# Patient Record
Sex: Male | Born: 1995 | Race: Black or African American | Hispanic: No | Marital: Single | Smoking: Current every day smoker
Health system: Southern US, Community
[De-identification: ages and names within clinical notes are randomized; demographics above are authoritative.]

## PROBLEM LIST (undated history)

## (undated) HISTORY — PX: KNEE SURGERY: SHX244

---
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
## 2018-03-03 ENCOUNTER — Encounter (HOSPITAL_COMMUNITY): Payer: Self-pay | Admitting: Emergency Medicine

## 2018-03-03 ENCOUNTER — Other Ambulatory Visit: Payer: Self-pay

## 2018-03-03 ENCOUNTER — Emergency Department (HOSPITAL_COMMUNITY)
Admission: EM | Admit: 2018-03-03 | Discharge: 2018-03-03 | Disposition: A | Discharge status: Home / Self Care | Payer: Self-pay | Attending: Emergency Medicine | Admitting: Emergency Medicine

## 2018-03-03 ENCOUNTER — Emergency Department (HOSPITAL_COMMUNITY): Payer: Self-pay

## 2018-03-03 DIAGNOSIS — Y9289 Other specified places as the place of occurrence of the external cause: Secondary | ICD-10-CM | POA: Insufficient documentation

## 2018-03-03 DIAGNOSIS — Y9389 Activity, other specified: Secondary | ICD-10-CM | POA: Insufficient documentation

## 2018-03-03 DIAGNOSIS — F17210 Nicotine dependence, cigarettes, uncomplicated: Secondary | ICD-10-CM | POA: Insufficient documentation

## 2018-03-03 DIAGNOSIS — S83402A Sprain of unspecified collateral ligament of left knee, initial encounter: Secondary | ICD-10-CM | POA: Insufficient documentation

## 2018-03-03 DIAGNOSIS — W1849XA Other slipping, tripping and stumbling without falling, initial encounter: Secondary | ICD-10-CM | POA: Insufficient documentation

## 2018-03-03 DIAGNOSIS — Y990 Civilian activity done for income or pay: Secondary | ICD-10-CM | POA: Insufficient documentation

## 2018-03-03 MED ORDER — NAPROXEN 500 MG PO TABS: 500.0000 mg | ORAL_TABLET | Freq: Two times a day (BID) | ORAL | 0 refills | Status: AC

## 2018-03-03 NOTE — ED Triage Notes (Signed)
Pt c/o L knee pain, injured at work, slipped on wet floor and pt states his knee turned inward. Able to walk to triage but pt reports pain with weight bearing and bending the knee.

## 2018-03-03 NOTE — ED Notes (Signed)
Ortho called 

## 2018-03-03 NOTE — ED Provider Notes (Signed)
[facility]-EMERGENCY DEPT Provider Note   CSN: [id number] Arrival date & time: 03/03/18  1306    History   Chief Complaint Chief Complaint  Patient presents with  . Knee Pain    HPI Scorpio Easterly is a 23 y.o. male who presents to the ED with left knee pain. Patient reports while at work last night he was walking into the kitchen area and slipped on grease on the floor causing him to twist his knee.      HPI  History reviewed. No pertinent past medical history.  There are no active problems to display for this patient.   History reviewed. No pertinent surgical history.      Home Medications    Prior to Admission medications   Medication Sig Start Date End Date Taking? Authorizing Provider  naproxen (NAPROSYN) 500 MG tablet Take 1 tablet (500 mg total) by mouth 2 (two) times daily. 03/03/18   Janne Napoleon, NP    Family History No family history on file.  Social History Social History   Tobacco Use  . Smoking status: Current Every Day Smoker    Packs/day: 0.50    Types: Cigarettes  . Smokeless tobacco: Never Used  Substance Use Topics  . Alcohol use: Yes  . Drug use: Yes    Types: Marijuana     Allergies   Patient has no known allergies.   Review of Systems Review of Systems  Musculoskeletal: Positive for arthralgias.       Left knee pain  All other systems reviewed and are negative.    Physical Exam Updated Vital Signs BP 101/70   Pulse 64   Temp 98.3 F (36.8 C) (Oral)   Resp 16   SpO2 100%   Physical Exam Vitals signs and nursing note reviewed.  Constitutional:      General: He is not in acute distress.    Appearance: He is well-developed.  HENT:     Head: Normocephalic.     Mouth/Throat:     Mouth: Mucous membranes are moist.  Eyes:     Conjunctiva/sclera: Conjunctivae normal.  Neck:     Musculoskeletal: Neck supple.  Cardiovascular:     Rate and Rhythm: Normal rate.  Pulmonary:     Effort: Pulmonary  effort is normal.  Musculoskeletal:     Left knee: He exhibits swelling. He exhibits no deformity, no laceration, no erythema and normal alignment. Decreased range of motion: due to pain. Tenderness found. MCL tenderness noted.     Comments: Pedal pulse 2+. Pain with range of motion.   Skin:    General: Skin is warm and dry.  Neurological:     Mental Status: He is alert and oriented to person, place, and time.      ED Treatments / Results  Labs (all labs ordered are listed, but only abnormal results are displayed) Labs Reviewed - No data to display  Radiology Dg Knee Complete 4 Views Left  Result Date: 03/03/2018 CLINICAL DATA:  Twisting injury left knee on a wet floor today. Pain. Initial encounter. EXAM: LEFT KNEE - COMPLETE 4+ VIEW COMPARISON:  None. FINDINGS: No evidence of fracture, dislocation, or joint effusion. No evidence of arthropathy or other focal bone abnormality. Soft tissues are unremarkable. IMPRESSION: Normal exam. Electronically Signed   By: Drusilla Kanner M.D.   On: 03/03/2018 14:05    Procedures Procedures (including critical care time)  Medications Ordered in ED Medications - No data to display   Initial Impression /  Assessment and Plan / ED Course  I have reviewed the triage vital signs and the nursing notes. 23 y.o. male here with left knee pain s/p injury at work last night stable for d/c without focal neuro deficits. Knee sleeve applied, crutches and pain management. F/u with ortho if symptoms are not improving with treatment.   Final Clinical Impressions(s) / ED Diagnoses   Final diagnoses:  Sprain of collateral ligament of left knee, initial encounter    ED Discharge Orders         Ordered    naproxen (NAPROSYN) 500 MG tablet  2 times daily     03/03/18 1533           Damian Leavell [place], [place] 03/03/18 2322    Gerhard Munch, MD 03/07/18 1900

## 2018-05-04 ENCOUNTER — Emergency Department (HOSPITAL_COMMUNITY)
Admission: EM | Admit: 2018-05-04 | Discharge: 2018-05-04 | Disposition: A | Discharge status: Home / Self Care | Payer: Self-pay | Attending: Emergency Medicine | Admitting: Emergency Medicine

## 2018-05-04 ENCOUNTER — Encounter (HOSPITAL_COMMUNITY): Payer: Self-pay

## 2018-05-04 ENCOUNTER — Other Ambulatory Visit: Payer: Self-pay

## 2018-05-04 DIAGNOSIS — Z7189 Other specified counseling: Secondary | ICD-10-CM | POA: Insufficient documentation

## 2018-05-04 DIAGNOSIS — F17210 Nicotine dependence, cigarettes, uncomplicated: Secondary | ICD-10-CM | POA: Insufficient documentation

## 2018-05-04 DIAGNOSIS — Z79899 Other long term (current) drug therapy: Secondary | ICD-10-CM | POA: Insufficient documentation

## 2018-05-04 NOTE — ED Triage Notes (Signed)
States just went to [place] and came back has no symptoms and boss wants a release to come back to work.

## 2018-05-04 NOTE — Discharge Instructions (Addendum)
There is no way to know whether you have COVID19 or not, and we are not currently testing patients unless it's indicated, which for you it is not. Since you've traveled, you will need to self-quarantine for at least 2 weeks and monitor for any symptoms at home. Stay well hydrated and get plenty of rest. Quarantine yourself at home. Follow up with your regular doctor as needed for any future medical concerns. Return to the ER for any changes or worsening conditions.

## 2018-05-04 NOTE — ED Provider Notes (Signed)
[facility]-EMERGENCY DEPT Provider Note   CSN: [id number] Arrival date & time: 05/04/18  1629    History   Chief Complaint Chief Complaint  Patient presents with  . work note    HPI    Peter Christensen is a 23 y.o. male who presents to the ED for a work note.  [place] and [place] today, and his job wants him to get a work note stating that he can return to work.  [place], denies any known sick contacts or COVID-19 exposures.  He denies having any fevers or chills, URI symptoms, cough, chest pain, shortness of breath, abdominal pain, nausea, vomiting, diarrhea, constipation, dysuria, hematuria, body aches, myalgias, arthralgias, numbness, tingling, focal weakness, or any other complaints at this time.  He just wants a work note.  The history is provided by the patient and medical records. No language interpreter was used.  Influenza  Presenting symptoms: no cough, no diarrhea, no fever, no myalgias, no nausea, no rhinorrhea, no shortness of breath, no sore throat and no vomiting   Associated symptoms: no chills     No past medical history on file.  There are no active problems to display for this patient.   No past surgical history on file.      Home Medications    Prior to Admission medications   Medication Sig Start Date End Date Taking? Authorizing Provider  naproxen (NAPROSYN) 500 MG tablet Take 1 tablet (500 mg total) by mouth 2 (two) times daily. 03/03/18   Janne NapoleonNeese, Hope M, NP    Family History No family history on file.  Social History Social History   Tobacco Use  . Smoking status: Current Every Day Smoker    Packs/day: 0.50    Types: Cigarettes  . Smokeless tobacco: Never Used  Substance Use Topics  . Alcohol use: Yes  . Drug use: Yes    Types: Marijuana     Allergies   Patient has no known allergies.   Review of Systems Review of Systems   Constitutional: Negative for chills and fever.  HENT: Negative for rhinorrhea and sore throat.   Respiratory: Negative for cough and shortness of breath.   Cardiovascular: Negative for chest pain.  Gastrointestinal: Negative for abdominal pain, constipation, diarrhea, nausea and vomiting.  Genitourinary: Negative for dysuria and hematuria.  Musculoskeletal: Negative for arthralgias and myalgias.  Skin: Negative for color change.  Allergic/Immunologic: Negative for immunocompromised state.  Neurological: Negative for weakness and numbness.  Psychiatric/Behavioral: Negative for confusion.   All other systems reviewed and are negative for acute change except as noted in the HPI.    Physical Exam Updated Vital Signs BP 120/89   Pulse (!) 58   Temp 99.1 F (37.3 C) (Oral)   Resp 14   Ht 6' (1.829 m)   Wt 68 kg   SpO2 100%   BMI 20.34 kg/m   Physical Exam Vitals signs and nursing note reviewed.  Constitutional:      General: He is not in acute distress.    Appearance: Normal appearance. He is well-developed. He is not toxic-appearing.     Comments: Afebrile, nontoxic, NAD  HENT:     Head: Normocephalic and atraumatic.  Eyes:     General:        Right eye: No discharge.        Left eye: No discharge.     Conjunctiva/sclera: Conjunctivae normal.  Neck:  Musculoskeletal: Normal range of motion and neck supple.  Cardiovascular:     Rate and Rhythm: Bradycardia present.     Pulses: Normal pulses.     Comments: Slight bradycardia similar to prior visit Pulmonary:     Effort: Pulmonary effort is normal. No respiratory distress.  Abdominal:     General: There is no distension.  Musculoskeletal: Normal range of motion.  Skin:    General: Skin is warm and dry.     Findings: No rash.  Neurological:     Mental Status: He is alert and oriented to person, place, and time.     Sensory: Sensation is intact. No sensory deficit.     Motor: Motor function is intact.  Psychiatric:         Mood and Affect: Mood and affect normal.        Behavior: Behavior normal.      ED Treatments / Results  Labs (all labs ordered are listed, but only abnormal results are displayed) Labs Reviewed - No data to display  EKG None  Radiology No results found.  Procedures Procedures (including critical care time)  Medications Ordered in ED Medications - No data to display   Initial Impression / Assessment and Plan / ED Course  I have reviewed the triage vital signs and the nursing notes.  Pertinent labs & imaging results that were available during my care of the patient were reviewed by me and considered in my medical decision making (see chart for details).        23 y.o. male here essentially for a work note.  Traveled to [place] and returned today, states he needs a work note. Pt with no symptoms, and no known exposures. Long discussion had with pt regarding the fact that we can't know who has it and is asymptomatic, so it's possible he was in contact with someone and didn't know it. Given his travel, he's been asked to self quarantine for 2 weeks to monitor himself at home. Work note was not provided given this uncertainty. Advised quarantining guidelines, and f/up with PCP as needed. I explained the diagnosis and have given explicit precautions to return to the ER including for any other new or worsening symptoms. The patient understands and accepts the medical plan as it's been dictated and I have answered their questions. Discharge instructions concerning home care and prescriptions have been given. The patient is STABLE and is discharged to home in good condition.    Final Clinical Impressions(s) / ED Diagnoses   Final diagnoses:  Advice Given About 2019 Novel Coronavirus Infection    ED Discharge Orders    [address] [place], [place] 05/04/18 1658    Geoffery Lyons, MD 05/05/18 [phone]

## 2020-05-16 IMAGING — CR DG KNEE COMPLETE 4+V*L*
4 series · 4 of 4 positions shown · non-contrast
Comparison: None.

CLINICAL DATA: Twisting injury left knee on a wet floor today.
Pain. Initial encounter.

EXAM:
LEFT KNEE - COMPLETE 4+ VIEW

[t knee ap left]
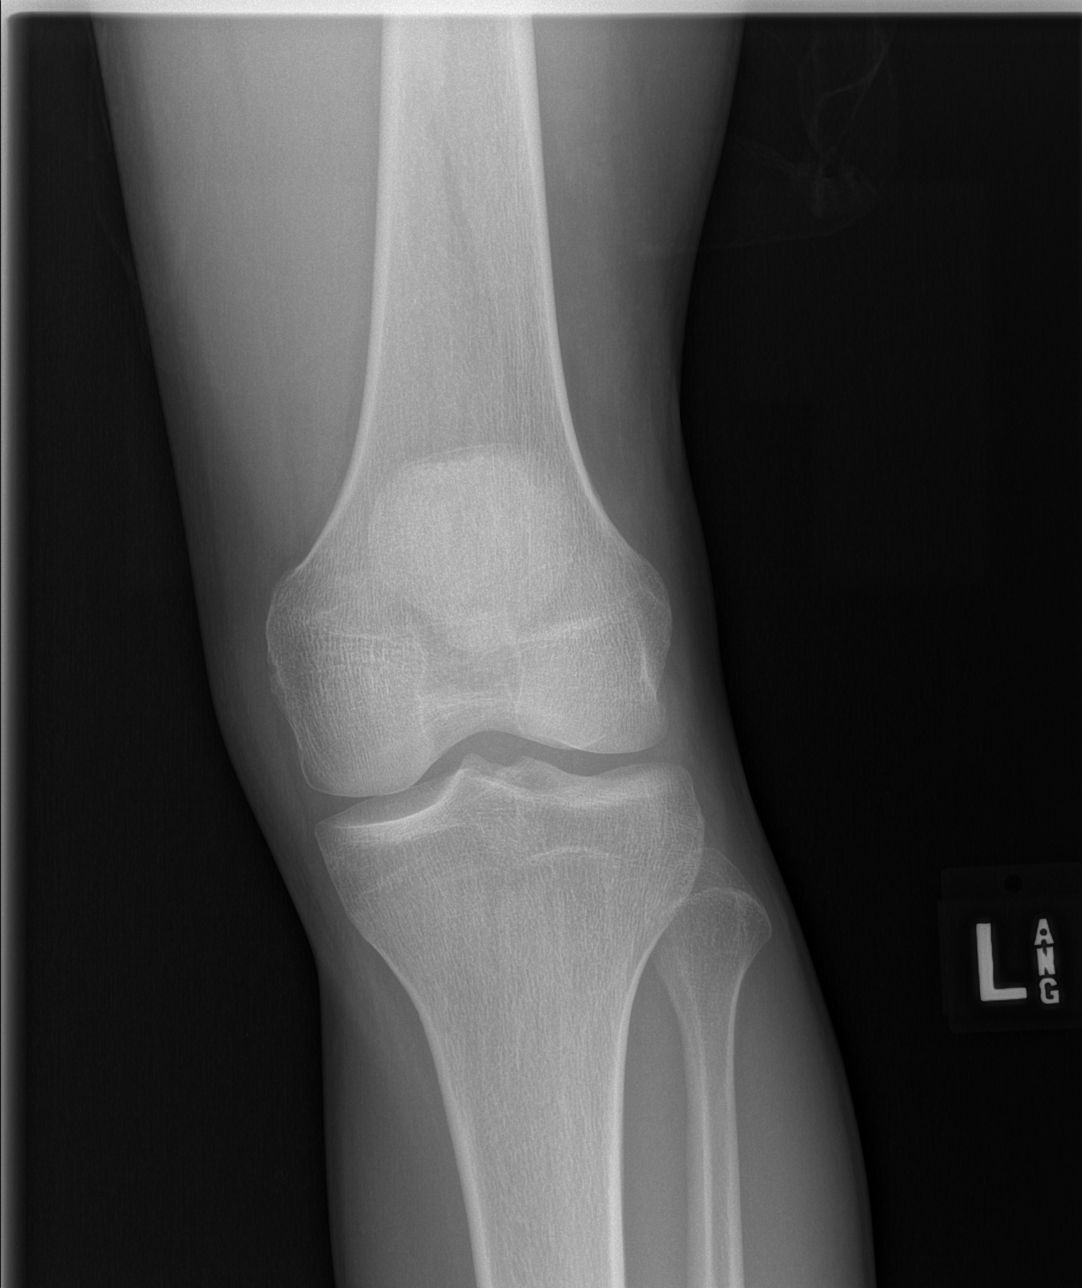

[t knee obl left (1 of 2)]
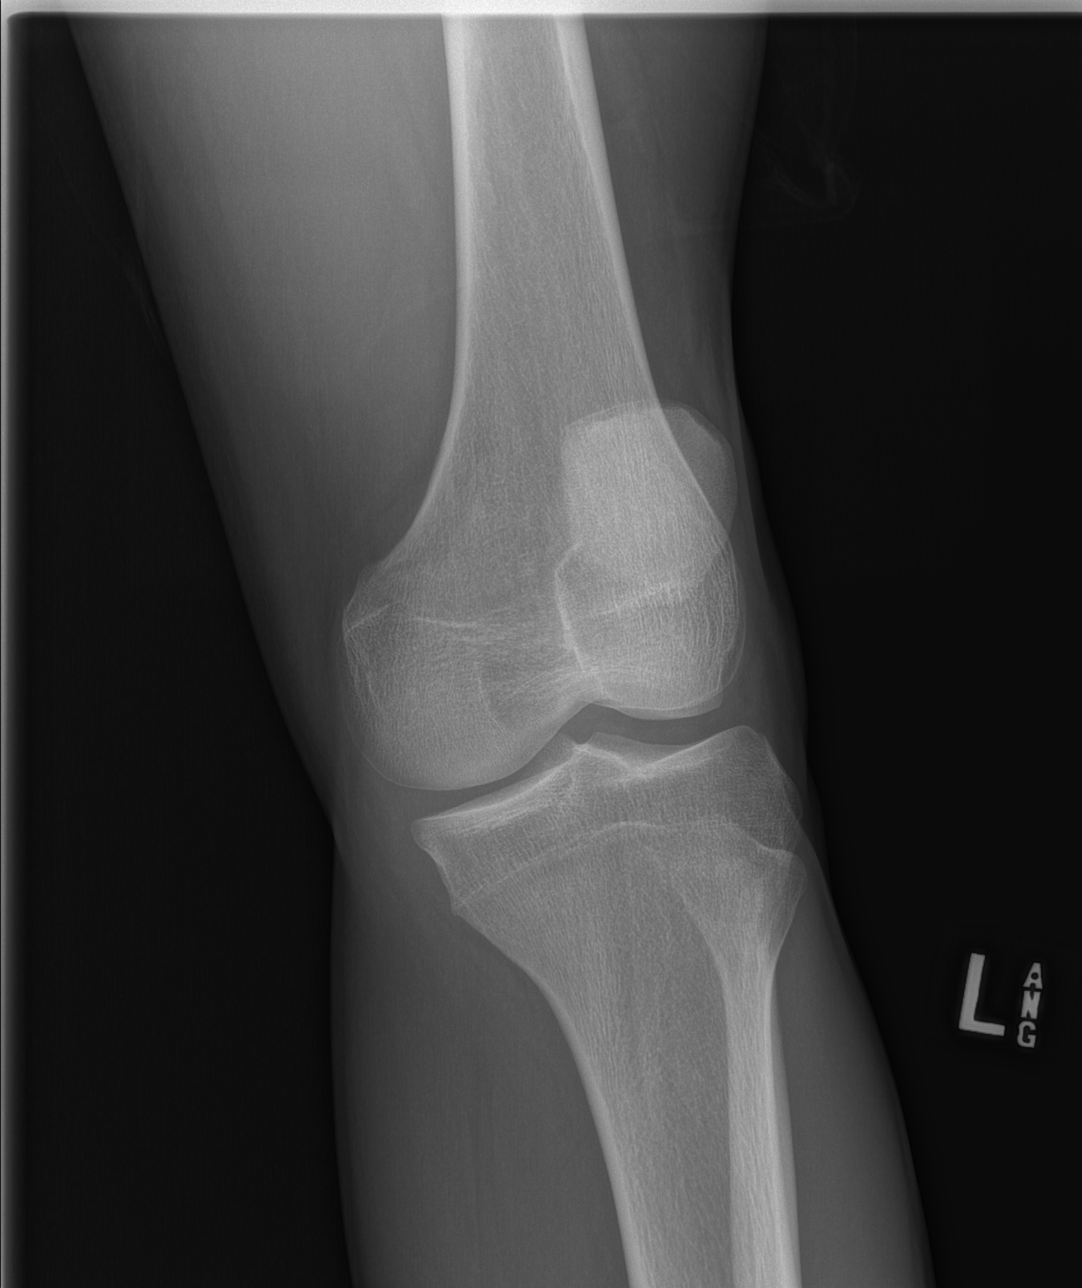

[t knee obl left (2 of 2)]
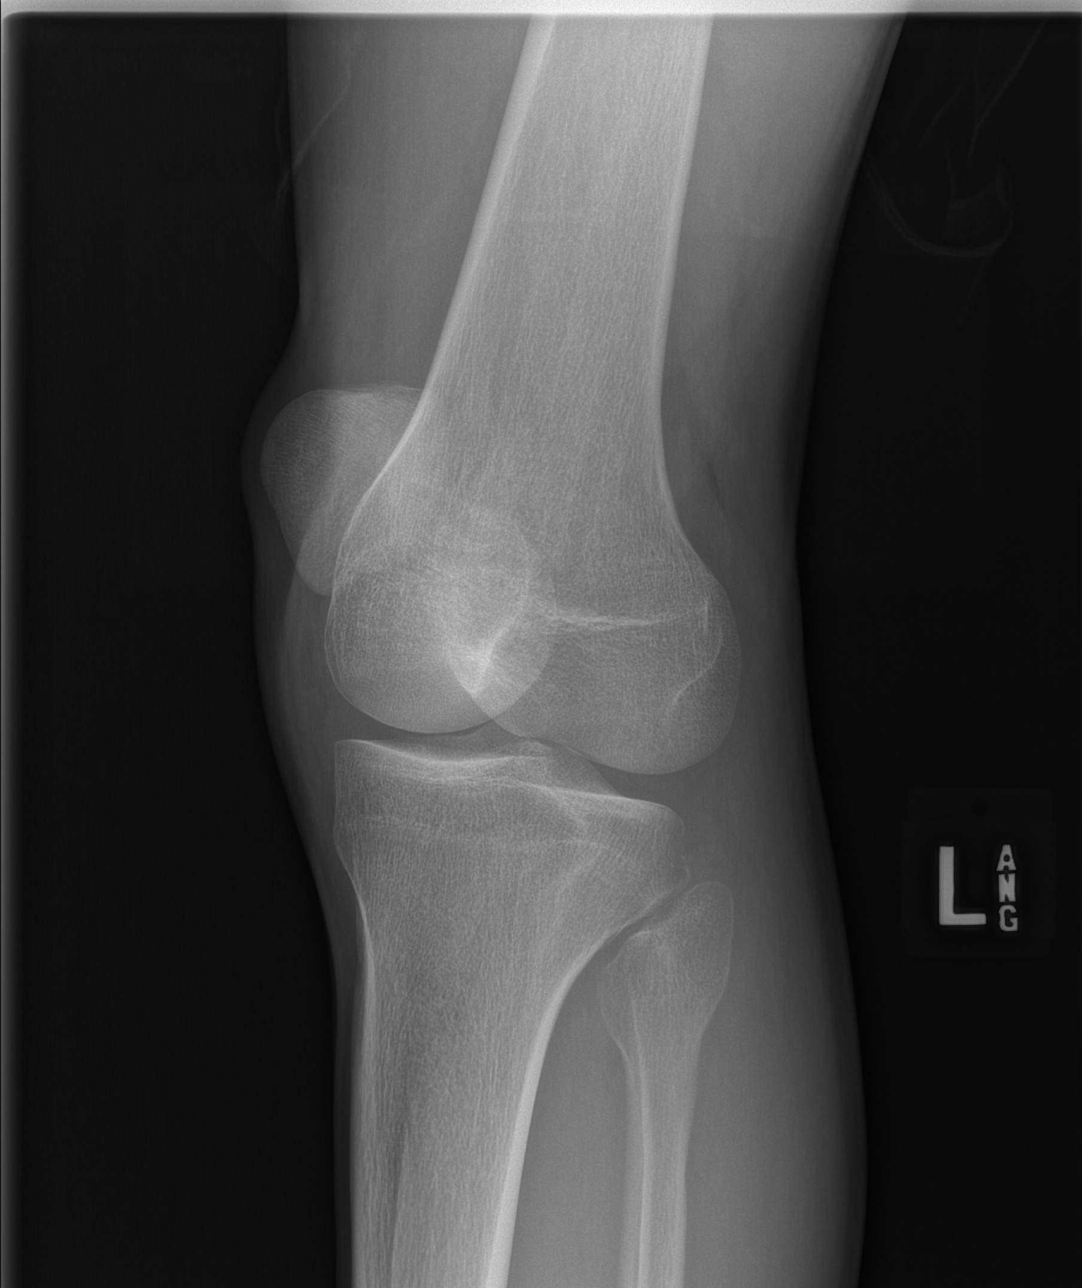

[t knee lat left]
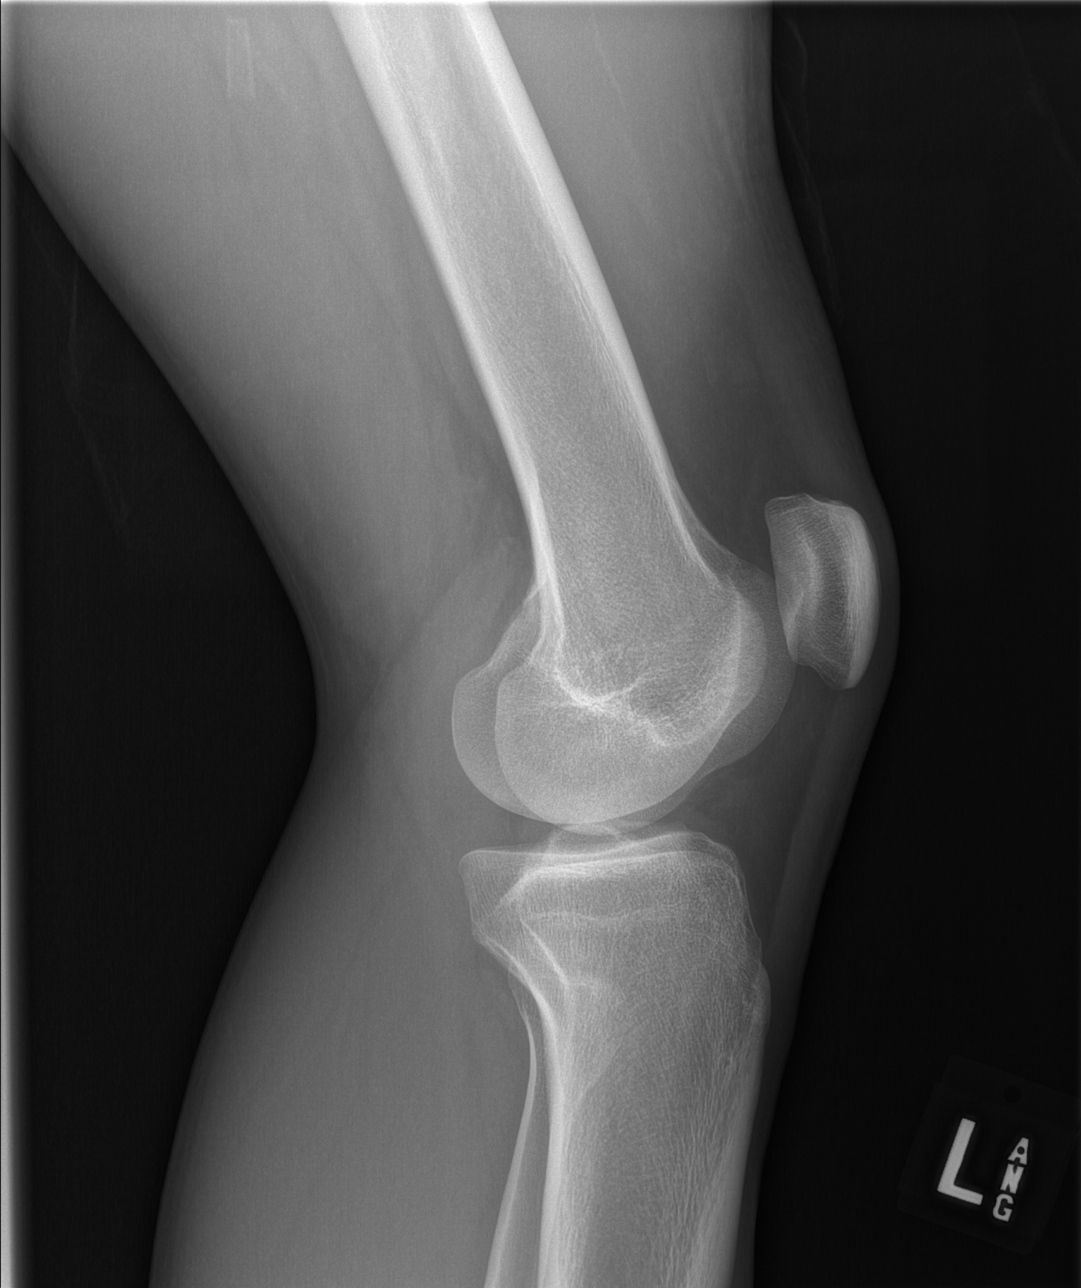

[4 of 4 positions shown; findings below may reference images not displayed]

FINDINGS: No evidence of fracture, dislocation, or joint effusion. No evidence
of arthropathy or other focal bone abnormality. Soft tissues are
unremarkable.
IMPRESSION: Normal exam.
